# Patient Record
Sex: Male | Born: 1970 | Race: Black or African American | Hispanic: No | Marital: Single | Smoking: Current some day smoker
Health system: Southern US, Community
[De-identification: ages and names within clinical notes are randomized; demographics above are authoritative.]

## PROBLEM LIST (undated history)

## (undated) HISTORY — PX: APPENDECTOMY: SHX54

## (undated) HISTORY — PX: OTHER SURGICAL HISTORY: SHX169

---

## 2011-01-17 ENCOUNTER — Emergency Department: Payer: Self-pay | Admitting: Emergency Medicine
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
## 2013-06-08 ENCOUNTER — Emergency Department: Payer: Self-pay | Admitting: Emergency Medicine

## 2014-09-04 ENCOUNTER — Emergency Department: Payer: Self-pay | Admitting: Emergency Medicine

## 2018-06-06 ENCOUNTER — Other Ambulatory Visit: Payer: Self-pay

## 2018-06-06 DIAGNOSIS — K529 Noninfective gastroenteritis and colitis, unspecified: Secondary | ICD-10-CM | POA: Insufficient documentation

## 2018-06-06 DIAGNOSIS — F17200 Nicotine dependence, unspecified, uncomplicated: Secondary | ICD-10-CM | POA: Insufficient documentation

## 2018-06-06 DIAGNOSIS — R1013 Epigastric pain: Secondary | ICD-10-CM | POA: Insufficient documentation

## 2018-06-06 LAB — CBC
HCT: 48 % (ref 39.0–52.0)
Hemoglobin: 15.8 g/dL (ref 13.0–17.0)
MCH: 29.4 pg (ref 26.0–34.0)
MCHC: 32.9 g/dL (ref 30.0–36.0)
MCV: 89.4 fL (ref 80.0–100.0)
Platelets: 368 10*3/uL (ref 150–400)
RBC: 5.37 MIL/uL (ref 4.22–5.81)
RDW: 13.2 % (ref 11.5–15.5)
WBC: 7 10*3/uL (ref 4.0–10.5)
nRBC: 0 % (ref 0.0–0.2)

## 2018-06-06 LAB — COMPREHENSIVE METABOLIC PANEL
ALT: 24 U/L (ref 0–44)
ANION GAP: 11 (ref 5–15)
AST: 26 U/L (ref 15–41)
Albumin: 3.8 g/dL (ref 3.5–5.0)
Alkaline Phosphatase: 46 U/L (ref 38–126)
BUN: 8 mg/dL (ref 6–20)
CO2: 24 mmol/L (ref 22–32)
Calcium: 8.8 mg/dL — ABNORMAL LOW (ref 8.9–10.3)
Chloride: 103 mmol/L (ref 98–111)
Creatinine, Ser: 1.03 mg/dL (ref 0.61–1.24)
GFR calc Af Amer: >60 mL/min (ref >60)
GFR calc non Af Amer: >60 mL/min (ref >60)
Glucose, Bld: 126 mg/dL — ABNORMAL HIGH (ref 70–99)
Potassium: 3.7 mmol/L (ref 3.5–5.1)
Sodium: 138 mmol/L (ref 135–145)
Total Bilirubin: 0.7 mg/dL (ref 0.3–1.2)
Total Protein: 6.8 g/dL (ref 6.5–8.1)

## 2018-06-06 LAB — LIPASE, BLOOD: Lipase: 25 U/L (ref 11–51)

## 2018-06-06 NOTE — ED Triage Notes (Addendum)
Pt arrives to ED via POV from home with c/o GERD-like s/x's x2 days. Pt denies N/V, but questions possible "food poisoning" since it started yesterday after Thanksgiving dinner. Pt reports epigastric abdominal pain without radiation, has taken OTC Tums without relief. Pt also reports intermittent chills since last night, as well, and unsure of actual fever. Pt denies SHOB or other "cold s/x's".

## 2018-06-06 NOTE — ED Notes (Signed)
Pt reports he is unable to urinate at this time; informed pt of need for urine sample to perform UA. Pt voices understanding and will let nursing staff know when he is able to produce a specimen.

## 2018-06-07 ENCOUNTER — Emergency Department
Admission: EM | Admit: 2018-06-07 | Discharge: 2018-06-07 | Disposition: A | Discharge status: Home / Self Care | Payer: Self-pay | Attending: Emergency Medicine | Admitting: Emergency Medicine

## 2018-06-07 DIAGNOSIS — K529 Noninfective gastroenteritis and colitis, unspecified: Secondary | ICD-10-CM

## 2018-06-07 MED ORDER — SODIUM CHLORIDE 0.9 % IV BOLUS
1000.0000 mL | Freq: Once | INTRAVENOUS | Status: AC
  Administered 2018-06-07: 1000 mL via INTRAVENOUS

## 2018-06-07 MED ORDER — ONDANSETRON HCL 4 MG/2ML IJ SOLN
4.0000 mg | Freq: Once | INTRAMUSCULAR | Status: AC
  Administered 2018-06-07: 4 mg via INTRAVENOUS
  Filled 2018-06-07: qty 2

## 2018-06-07 MED ORDER — ONDANSETRON 4 MG PO TBDP: 4.0000 mg | ORAL_TABLET | Freq: Three times a day (TID) | ORAL | 0 refills | Status: AC | PRN

## 2018-06-07 MED ORDER — LOPERAMIDE HCL 2 MG PO CAPS
4.0000 mg | ORAL_CAPSULE | Freq: Once | ORAL | Status: AC
  Administered 2018-06-07: 4 mg via ORAL
  Filled 2018-06-07: qty 2

## 2018-06-07 NOTE — ED Provider Notes (Signed)
[facility] Emergency Department Provider Note    First Dennis Braun Initiated Contact with Patient 06/07/18 [phone]     (approximate)  I have reviewed the triage vital signs and the nursing notes.   HISTORY  Chief Complaint Gastroesophageal Reflux   HPI Dennis Braun is a 47 y.o. male presents to the emergency department with 2-day history of nausea and vomiting with associated diarrhea.  Patient admits to epigastric abdominal discomfort.  Patient states symptoms are unrelieved with over-the-counter Tums.  Patient states that symptoms began after eating Thanksgiving dinner and is concerned about the possibly food poisoning.  No known sick contact   History reviewed. No pertinent past medical history.  There are no active problems to display for this patient.   Past Surgical History:  Procedure Laterality Date  . APPENDECTOMY    . fused ankle    . toe amputations      Prior to Admission medications   Medication Sig Start Date End Date Taking? Authorizing Provider  ondansetron (ZOFRAN ODT) 4 MG disintegrating tablet Take 1 tablet (4 mg total) by mouth every 8 (eight) hours as needed for nausea or vomiting. 06/07/18   Dennis Current, Dennis Braun    Allergies Patient has no known allergies.  No family history on file.  Social History Social History   Tobacco Use  . Smoking status: Current Some Day Smoker  . Smokeless tobacco: Never Used  Substance Use Topics  . Alcohol use: Not on file  . Drug use: Not on file    Review of Systems Constitutional: No fever/chills Eyes: No visual changes. ENT: No sore throat. Cardiovascular: Denies chest pain. Respiratory: Denies shortness of breath. Gastrointestinal: Positive for nausea vomiting and diarrhea Genitourinary: Negative for dysuria. Musculoskeletal: Negative for neck pain.  Negative for back pain. Integumentary: Negative for rash. Neurological: Negative for headaches, focal weakness or  numbness.  ____________________________________________   PHYSICAL EXAM:  VITAL SIGNS: ED Triage Vitals  Enc Vitals Group     BP 06/06/18 2204 (!) 139/92     Pulse Rate 06/06/18 2204 82     Resp 06/06/18 2204 16     Temp 06/06/18 2204 99 F (37.2 C)     Temp Source 06/06/18 2204 Oral     SpO2 06/06/18 2204 98 %     Weight 06/06/18 2201 88 kg (194 lb)     Height 06/06/18 2201 1.778 m (5\' 10" )     Head Circumference --      Peak Flow --      Pain Score 06/06/18 2200 8     Pain Loc --      Pain Edu? --      Excl. in GC? --     Constitutional: Alert and oriented. Well appearing and in no acute distress. Eyes: Conjunctivae are normal.  Head: Atraumatic. Mouth/Throat: Mucous membranes are moist. Oropharynx non-erythematous. Neck: No stridor.   Cardiovascular: Normal rate, regular rhythm. Good peripheral circulation. Grossly normal heart sounds. Respiratory: Normal respiratory effort.  No retractions. Lungs CTAB. Gastrointestinal: Soft and nontender. No distention.  Musculoskeletal: No lower extremity tenderness nor edema. No gross deformities of extremities. Neurologic:  Normal speech and language. No gross focal neurologic deficits are appreciated.  Skin:  Skin is warm, dry and intact. No rash noted. Psychiatric: Mood and affect are normal. Speech and behavior are normal.  ____________________________________________   LABS (all labs ordered are listed, but only abnormal results are displayed)  Labs Reviewed  COMPREHENSIVE METABOLIC PANEL - Abnormal;  Notable for the following components:      Result Value   Glucose, Bld 126 (*)    Calcium 8.8 (*)    All other components within normal limits  LIPASE, BLOOD  CBC   Procedures   ____________________________________________   INITIAL IMPRESSION / ASSESSMENT AND PLAN / ED COURSE  As part of my medical decision making, I reviewed the following data within the electronic MEDICAL RECORD NUMBER   47 year old male  presenting with above-stated history and physical exam concerning for possible gastroenteritis.  Given absence of abdominal pain on exam no imaging performed.  Patient given 2 L IV normal saline Zofran 4 mg with resolution of symptoms.  Patient will be prescribed Zofran for home. ____________________________________________  FINAL CLINICAL IMPRESSION(S) / ED DIAGNOSES  Final diagnoses:  Gastroenteritis     MEDICATIONS GIVEN DURING THIS VISIT:  Medications  ondansetron (ZOFRAN) injection 4 mg (4 mg Intravenous Given 06/07/18 0116)  sodium chloride 0.9 % bolus 1,000 mL (0 mLs Intravenous Stopped 06/07/18 0226)  sodium chloride 0.9 % bolus 1,000 mL (0 mLs Intravenous Stopped 06/07/18 0226)  loperamide (IMODIUM) capsule 4 mg (4 mg Oral Given 06/07/18 0117)     ED Discharge Orders         Ordered    ondansetron (ZOFRAN ODT) 4 MG disintegrating tablet  Every 8 hours PRN     06/07/18 0229           Note:  This document was prepared using Dragon voice recognition software and may include unintentional dictation errors.    Dennis Braun, Dennis [place] Braun, Dennis Braun 06/07/18 [phone]

## 2021-10-27 ENCOUNTER — Encounter: Payer: Self-pay | Admitting: Nurse Practitioner

## 2021-10-27 ENCOUNTER — Ambulatory Visit: Payer: Self-pay | Admitting: Nurse Practitioner

## 2021-10-27 DIAGNOSIS — Z113 Encounter for screening for infections with a predominantly sexual mode of transmission: Secondary | ICD-10-CM

## 2021-10-27 DIAGNOSIS — Z202 Contact with and (suspected) exposure to infections with a predominantly sexual mode of transmission: Secondary | ICD-10-CM

## 2021-10-27 LAB — HM HIV SCREENING LAB: HM HIV Screening: NEGATIVE

## 2021-10-27 LAB — HM HEPATITIS C SCREENING LAB: HM Hepatitis Screen: NEGATIVE

## 2021-10-27 LAB — HEPATITIS B SURFACE ANTIGEN: Hepatitis B Surface Ag: NONREACTIVE

## 2021-10-27 MED ORDER — METRONIDAZOLE 500 MG PO TABS: 500.0000 mg | ORAL_TABLET | Freq: Two times a day (BID) | ORAL | 0 refills | Status: AC

## 2021-10-27 MED ORDER — METRONIDAZOLE 500 MG PO TABS: 500.0000 mg | ORAL_TABLET | Freq: Two times a day (BID) | ORAL | 0 refills | Status: DC

## 2021-10-27 NOTE — Progress Notes (Signed)
[facility] Department ?STI clinic/screening visit ? ?Subjective:  ?Dennis Braun is a 51 y.o. male being seen today for an STI screening visit. The patient reports they do not have symptoms.   ? ?Patient has the following medical conditions:  There are no problems to display for this patient. ? ? ? ?Chief Complaint  ?Patient presents with  ? SEXUALLY TRANSMITTED DISEASE  ?  Screening ?  ? ? ?HPI ? ?Patient reports to clinic today for STD screening.  Patient states he is a contact to [place].  ? ?Does the patient or their partner desires a pregnancy in the next year? No ? ?Screening for MPX risk: ?Does the patient have an unexplained rash? No ?Is the patient MSM? No ?Does the patient endorse multiple sex partners or anonymous sex partners? No ?Did the patient have close or sexual contact with a person diagnosed with MPX? No ?Has the patient traveled outside the [place] where MPX is endemic? No ?Is there a high clinical suspicion for MPX-- evidenced by one of the following No ? -Unlikely to be chickenpox ? -Lymphadenopathy ? -Rash that present in same phase of evolution on any given body part ? ? ?See flowsheet for further details and programmatic requirements.  ? ? ?The following portions of the patient's history were reviewed and updated as appropriate: allergies, current medications, past medical history, past social history, past surgical history and problem list. ? ?Objective:  ?There were no vitals filed for this visit. ? ?Physical Exam ?Constitutional:   ?   Appearance: Normal appearance.  ?HENT:  ?   Head: Normocephalic.  ?   Right Ear: External ear normal.  ?   Left Ear: External ear normal.  ?   Nose: Nose normal.  ?   Mouth/Throat:  ?   Lips: Pink.  ?   Mouth: Mucous membranes are moist.  ?   Comments: Poor dentition  ?Pulmonary:  ?   Effort: Pulmonary effort is normal.  ?Abdominal:  ?   General: Abdomen is flat.  ?   Palpations: Abdomen is soft.  ?Genitourinary: ?   Penis: Circumcised.   ?   Comments:  Pubic area without nits, lice, hair loss, edema, erythema, lesions and inguinal adenopathy. ?Penis without rash, lesions and discharge at meatus. ?Testicles descended bilaterally,nt, no masses or edema.  ?Musculoskeletal:  ?   Cervical back: Full passive range of motion without pain, normal range of motion and neck supple.  ?Skin: ?   General: Skin is warm and dry.  ?Neurological:  ?   Mental Status: He is alert and oriented to person, place, and time.  ?Psychiatric:     ?   Attention and Perception: Attention normal.     ?   Mood and Affect: Mood normal.     ?   Speech: Speech normal.     ?   Behavior: Behavior is cooperative.  ? ? ? ? ?Assessment and Plan:  ?Dennis Braun is a 51 y.o. male presenting to the [facility] Department for STI screening ? ?1. Screening examination for venereal disease ?-51 year old male in clinic today for STD screening. Patient is a contact to [place].  ?-Patient does not have STI symptoms ?Patient accepted all screenings including  oral GC and bloodwork for HIV/RPR, HCV/HBV. ?Patient meets criteria for HepB screening? Yes. Ordered? Yes ?Patient meets criteria for HepC screening? Yes. Ordered? Yes ?Recommended condom use with all sex ?Discussed importance of condom use for STI prevent ? ?Treat  gram stain per standing order ?Discussed time line for [facility] results and that patient will be called with positive results and encouraged patient to call if he had not heard in 2 weeks ?Recommended returning for continued or worsening symptoms.   ?- Gonococcus culture ?- Gonococcus culture ?- Gram stain ?- HIV/HCV [place] [facility] ?- Syphilis Serology, [place] [facility] ?- HBV Antigen/Antibody [facility] ? ?2. Exposure to trichomonas ?-Please treat patient today as a contact to [place].  Advised patient to abstain from sex for 7 days.   ?- metroNIDAZOLE (FLAGYL) 500 MG tablet; Take 1 tablet (500 mg total) by mouth 2 (two) times daily.  Dispense: 14 tablet; Refill: 0 ? ? ? ? ?Return if symptoms  worsen or fail to improve. ? ? ?Gregary Cromer, FNP ? ?

## 2021-10-27 NOTE — Progress Notes (Signed)
Pt here for STD screening.  Gram stain results reviewed.  Medication dispensed per Provider orders.  Condoms declined.  Brinson Tozzi M Sireen Halk, RN ? ?

## 2021-10-30 LAB — GRAM STAIN

## 2021-11-01 LAB — GONOCOCCUS CULTURE
